# Patient Record
Sex: Female | Born: 1995 | Race: White | Hispanic: No | Marital: Married | Smoking: Never smoker
Health system: Southern US, Community
[De-identification: ages and names within clinical notes are randomized; demographics above are authoritative.]

---

## 2021-07-11 ENCOUNTER — Other Ambulatory Visit: Payer: Self-pay

## 2021-07-11 ENCOUNTER — Emergency Department (HOSPITAL_BASED_OUTPATIENT_CLINIC_OR_DEPARTMENT_OTHER): Payer: BC Managed Care – PPO

## 2021-07-11 ENCOUNTER — Emergency Department (HOSPITAL_BASED_OUTPATIENT_CLINIC_OR_DEPARTMENT_OTHER)
Admission: EM | Admit: 2021-07-11 | Discharge: 2021-07-11 | Disposition: A | Discharge status: Home / Self Care | Payer: BC Managed Care – PPO | Attending: Emergency Medicine | Admitting: Emergency Medicine

## 2021-07-11 ENCOUNTER — Encounter (HOSPITAL_BASED_OUTPATIENT_CLINIC_OR_DEPARTMENT_OTHER): Payer: Self-pay | Admitting: *Deleted

## 2021-07-11 DIAGNOSIS — R072 Precordial pain: Secondary | ICD-10-CM | POA: Insufficient documentation

## 2021-07-11 DIAGNOSIS — D72829 Elevated white blood cell count, unspecified: Secondary | ICD-10-CM | POA: Diagnosis not present

## 2021-07-11 DIAGNOSIS — R079 Chest pain, unspecified: Secondary | ICD-10-CM

## 2021-07-11 LAB — CBC
HCT: 41.9 % (ref 36.0–46.0)
Hemoglobin: 13.5 g/dL (ref 12.0–15.0)
MCH: 24.2 pg — ABNORMAL LOW (ref 26.0–34.0)
MCHC: 32.2 g/dL (ref 30.0–36.0)
MCV: 75.1 fL — ABNORMAL LOW (ref 80.0–100.0)
Platelets: 470 10*3/uL — ABNORMAL HIGH (ref 150–400)
RBC: 5.58 MIL/uL — ABNORMAL HIGH (ref 3.87–5.11)
RDW: 15.4 % (ref 11.5–15.5)
WBC: 12 10*3/uL — ABNORMAL HIGH (ref 4.0–10.5)
nRBC: 0 % (ref 0.0–0.2)

## 2021-07-11 LAB — TROPONIN I (HIGH SENSITIVITY)
Troponin I (High Sensitivity): 4 ng/L (ref <18)
Troponin I (High Sensitivity): 3 ng/L (ref <18)

## 2021-07-11 LAB — PREGNANCY, URINE: Preg Test, Ur: NEGATIVE

## 2021-07-11 LAB — BASIC METABOLIC PANEL
Anion gap: 8 (ref 5–15)
BUN: 10 mg/dL (ref 6–20)
CO2: 25 mmol/L (ref 22–32)
Calcium: 10 mg/dL (ref 8.9–10.3)
Chloride: 102 mmol/L (ref 98–111)
Creatinine, Ser: 0.7 mg/dL (ref 0.44–1.00)
GFR, Estimated: >60 mL/min (ref >60)
Glucose, Bld: 85 mg/dL (ref 70–99)
Potassium: 3.7 mmol/L (ref 3.5–5.1)
Sodium: 135 mmol/L (ref 135–145)

## 2021-07-11 LAB — LIPASE, BLOOD: Lipase: 32 U/L (ref 11–51)

## 2021-07-11 NOTE — ED Triage Notes (Signed)
States that this past Monday began having mid sternal sharp/ stabbing intermittent, mid sternal chest pain, denies any nausea, vomiting, diaphoresis or shortness of breath. Hx includes pre diabetes only. States pain normally presents first thing in am when awaking, comes and goes. Today the change was mid sternal stabbing chest pain that radiated to her rt arm.  ?

## 2021-07-11 NOTE — ED Notes (Signed)
Lab notified of Lipase lab requested ?

## 2021-07-11 NOTE — ED Notes (Signed)
No complaints on having dependent edema, denies any orthopnea, PND or bendopnea. Denies any dyspnea on exertion. Denies being on any long distance trips or being stagnant with activity ?

## 2021-07-11 NOTE — ED Notes (Signed)
Cont to deny any further pain, awaiting for final lab results ?

## 2021-07-11 NOTE — ED Notes (Signed)
Patient denies pain and is resting comfortably.  

## 2021-07-11 NOTE — ED Provider Notes (Signed)
?[facility] EMERGENCY DEPARTMENT ?Provider Note ? ? ?CSN: [id number] ?Arrival date & time: 07/11/21  1009 ? ?  ? ?History ? ?Chief Complaint  ?Patient presents with  ? Chest Pain  ? ? ?Kaylee Cabrera is a 26 y.o. female.  Patient presents with complaints of chest pain.  States that she has had substernal chest pain that began Monday and has been intermittent in nature.  Currently asymptomatic.  The patient's pain at one time radiated to her back.  Today patient's pain radiated to her right chest and to her right arm.  Denies nausea, denies shortness of breath, denies abdominal pain.  PMH is significant for PCOS and prediabetes ? ?HPI ? ?  ? ?Home Medications ?Prior to Admission medications   ?Not on File  ?   ? ?Allergies    ?Patient has no allergy information on record.   ? ?Review of Systems   ?Review of Systems  ?Constitutional:  Negative for fever.  ?Respiratory:  Negative for cough and shortness of breath.   ?Cardiovascular:  Positive for chest pain.  ?     Right sided  ?Gastrointestinal:  Negative for abdominal pain and nausea.  ?Genitourinary:  Negative for dysuria.  ? ?Physical Exam ?Updated Vital Signs ?BP 104/77   Pulse 81   Temp 98.1 ?F (36.7 ?C) (Oral)   Resp (!) 25   Ht 4\' 10"  (1.473 m)   Wt 104.8 kg   LMP 07/11/2021   SpO2 98%   BMI 48.28 kg/m?  ?Physical Exam ?Vitals and nursing note reviewed.  ?Constitutional:   ?   General: She is not in acute distress. ?   Appearance: She is obese.  ?HENT:  ?   Head: Normocephalic and atraumatic.  ?Eyes:  ?   Pupils: Pupils are equal, round, and reactive to light.  ?Cardiovascular:  ?   Rate and Rhythm: Normal rate and regular rhythm.  ?   Heart sounds: Normal heart sounds.  ?Pulmonary:  ?   Effort: Pulmonary effort is normal. No tachypnea.  ?Musculoskeletal:  ?   Cervical back: Normal range of motion.  ?Neurological:  ?   Mental Status: She is alert.  ? ? ?ED Results / Procedures / Treatments   ?Labs ?(all labs ordered are listed, but only abnormal  results are displayed) ?Labs Reviewed  ?CBC - Abnormal; Notable for the following components:  ?    Result Value  ? WBC 12.0 (*)   ? RBC 5.58 (*)   ? MCV 75.1 (*)   ? MCH 24.2 (*)   ? Platelets 470 (*)   ? All other components within normal limits  ?BASIC METABOLIC PANEL  ?PREGNANCY, URINE  ?LIPASE, BLOOD  ?TROPONIN I (HIGH SENSITIVITY)  ?TROPONIN I (HIGH SENSITIVITY)  ? ? ?EKG ?EKG Interpretation ? ?Date/Time:  Thursday July 11 2021 10:18:56 EDT ?Ventricular Rate:  94 ?PR Interval:  159 ?QRS Duration: 89 ?QT Interval:  324 ?QTC Calculation: 406 ?R Axis:   63 ?Text Interpretation: Sinus rhythm Confirmed by [id number] [phone]) on 07/11/2021 10:32:34 AM ? ?Radiology ?DG Chest 2 View ? ?Result Date: 07/11/2021 ?CLINICAL DATA:  Chest pain EXAM: CHEST - 2 VIEW COMPARISON:  05/29/2010 FINDINGS: The heart size and mediastinal contours are within normal limits. Both lungs are clear. The visualized skeletal structures are unremarkable. IMPRESSION: No active cardiopulmonary disease. Electronically Signed   By: 05/31/2010 M.D.   On: 07/11/2021 10:45   ? ?Procedures ?Procedures  ? ? ?Medications Ordered in ED ?Medications - No  data to display ? ?ED Course/ Medical Decision Making/ A&P ?  ?                        ?Medical Decision Making ?Amount and/or Complexity of Data Reviewed ?Labs: ordered. ?Radiology: ordered. ? ? ?This patient presents to the ED for concern of chest pain, this involves an extensive number of treatment options, and is a complaint that carries with it a high risk of complications and morbidity.  The differential diagnosis includes but is not limited to ACS, PE, pneumonia, and others ? ? ? ?Lab Tests: ? ?I Ordered, and personally interpreted labs.  The pertinent results include: WBC 12.0 ? ? ?Imaging Studies ordered: ? ?I ordered imaging studies including chest x-ray ?I independently visualized and interpreted imaging which showed no acute disease ?I agree with the radiologist  interpretation ? ? ?Cardiac Monitoring: / EKG: ? ?The patient was maintained on a cardiac monitor.  I personally viewed and interpreted the cardiac monitored which showed an underlying rhythm of: Sinus rhythm ? ?Problem List / ED Course / Critical interventions / Medication management ? ? ?I have reviewed the patients home medicines and have made adjustments as needed ? ?Test / Admission - Considered: ? ?Heart score 4.  No EKG changes that would suggest a cardiac etiology.  Troponin initial read is 4.  ACS highly unlikely.  The patient has having no shortness of breath, and is not tachycardic.  Her symptoms have been intermittent.  Low clinical suspicion for PE.  No signs of pneumonia on chest x-ray, no shortness of breath.  Clinical suspicion for pneumonia is very low as well. ? ?The patient has nonspecific chest pain at this time.  She does have a mild white count which I would recommend having rechecked by primary care in 2 weeks.  I see no reason for further work-up at this time in the emergent setting.  Discharge home. ? ?Final Clinical Impression(s) / ED Diagnoses ?Final diagnoses:  ?Chest pain, unspecified type  ? ? ?Rx / DC Orders ?ED Discharge Orders   ? ? None  ? ?  ? ? ?  ?Darrick Grinder, PA-C ?07/11/21 1324 ? ?  ?Vanetta Mulders, MD ?07/13/21 1730 ? ?

## 2021-07-11 NOTE — Discharge Instructions (Signed)
You were seen today for chest pain.  Your work-up was reassuring for no acute cardiac cause and for no pneumonia.  You did have a mildly elevated white blood cell count today.  I recommend following up with your primary care provider in approximately 2 weeks for repeat labs to make sure that this has resolved.  Return to the emergency department as needed ?

## 2021-07-11 NOTE — ED Notes (Signed)
Currently on her period ?

## 2023-04-15 IMAGING — CR DG CHEST 2V
2 series · 2 of 2 positions shown · non-contrast
Comparison: 05/29/2010

CLINICAL DATA: Chest pain

EXAM:
CHEST - 2 VIEW

[w chest pa]
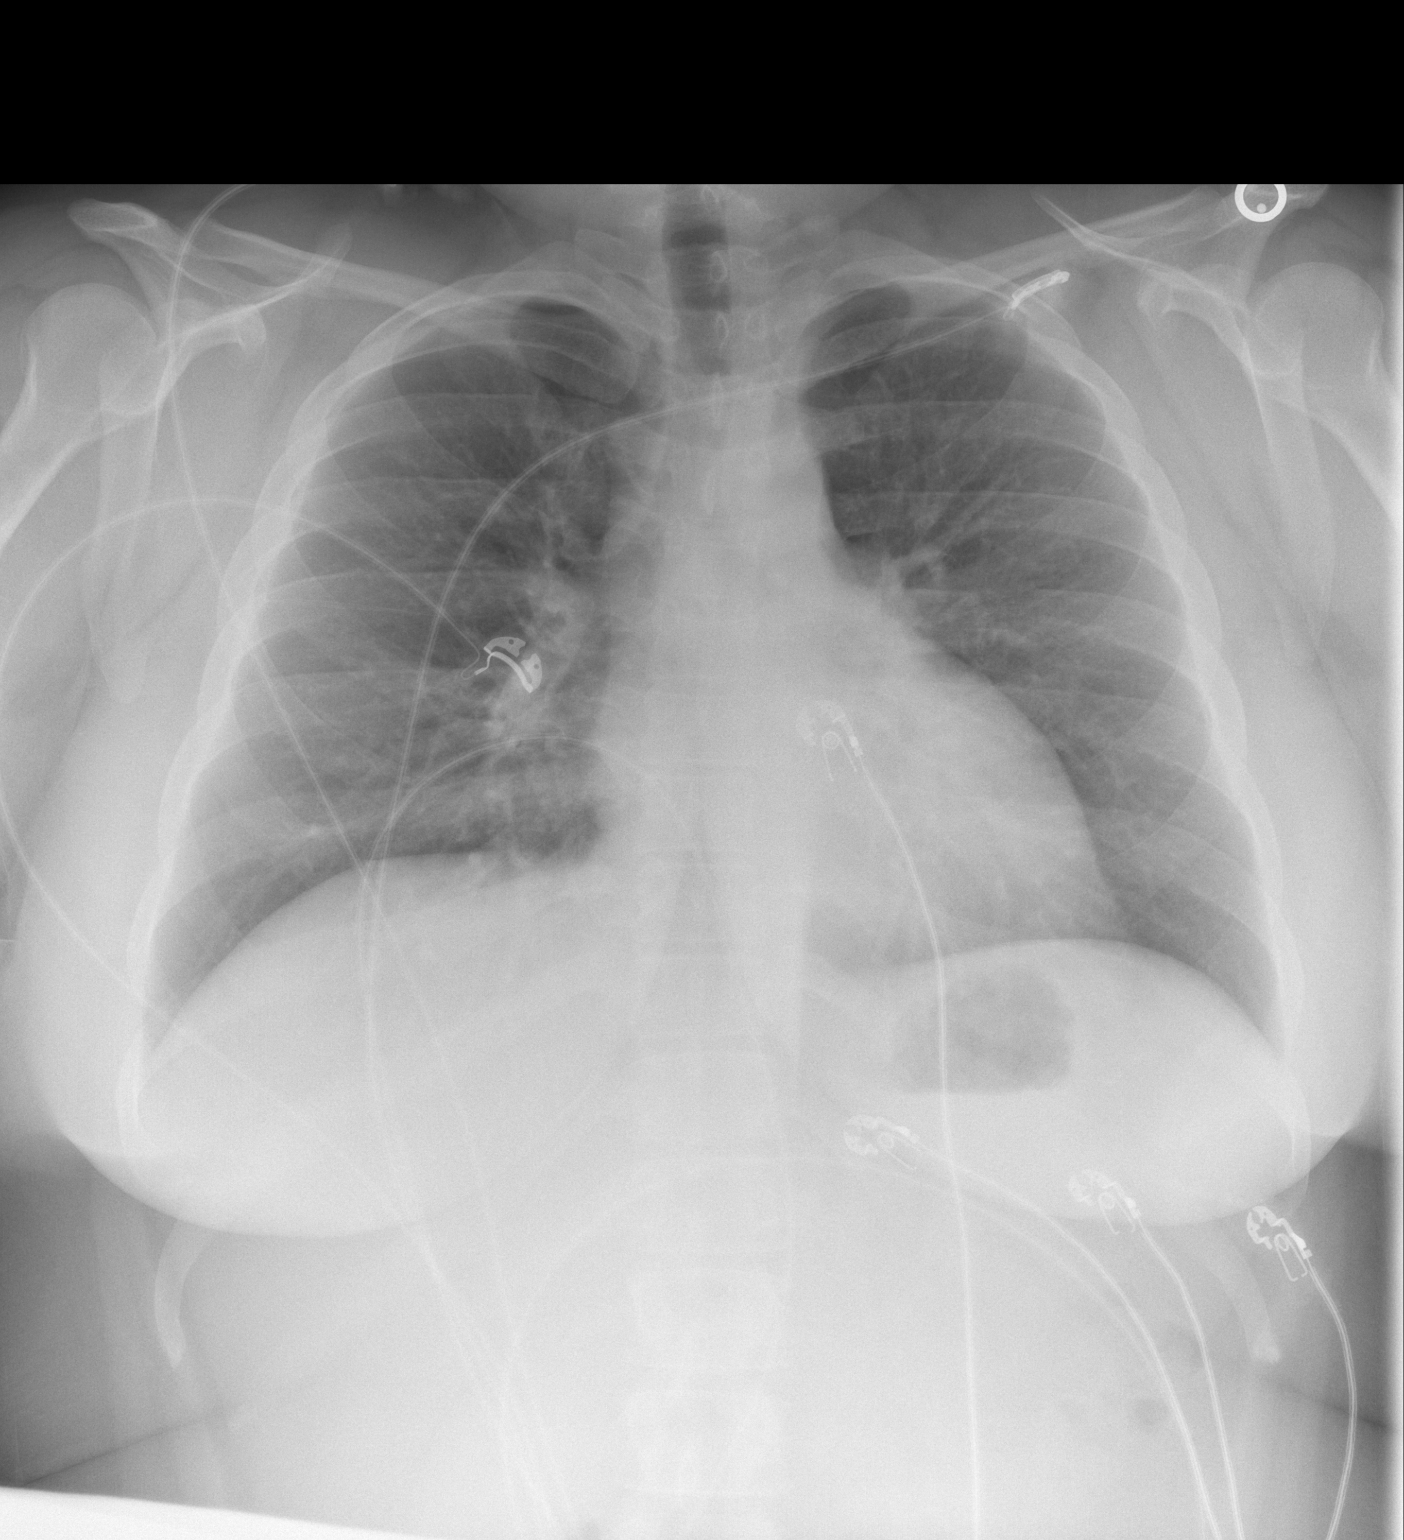

[w chest lat]
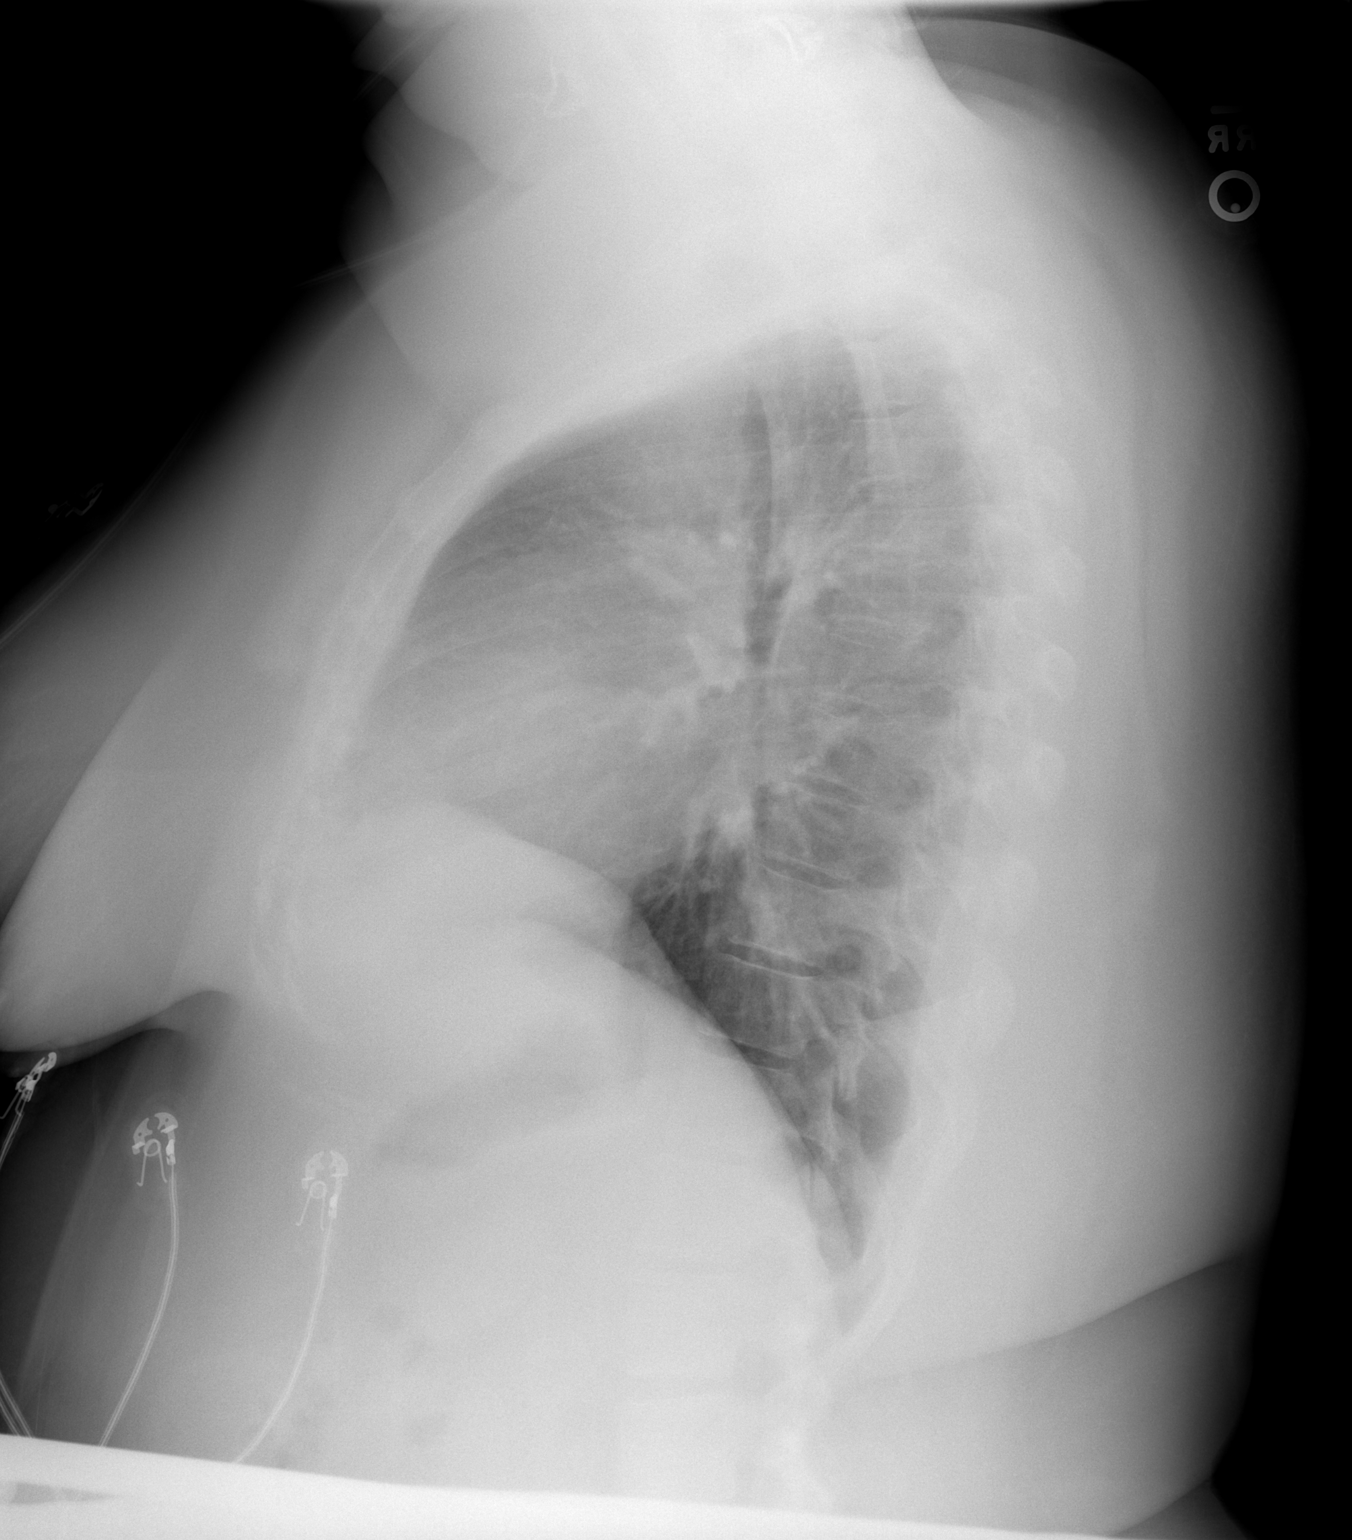

[2 of 2 positions shown; findings below may reference images not displayed]

FINDINGS: The heart size and mediastinal contours are within normal limits.
Both lungs are clear. The visualized skeletal structures are
unremarkable.
IMPRESSION: No active cardiopulmonary disease.
